# Patient Record
Sex: Female | Born: 1995 | Race: White | Hispanic: No | Marital: Single | Smoking: Never smoker
Health system: Southern US, Community
[De-identification: ages and names within clinical notes are randomized; demographics above are authoritative.]

---
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
## 2017-07-31 ENCOUNTER — Encounter (HOSPITAL_COMMUNITY): Payer: Self-pay | Admitting: Emergency Medicine

## 2017-07-31 DIAGNOSIS — K529 Noninfective gastroenteritis and colitis, unspecified: Secondary | ICD-10-CM | POA: Diagnosis not present

## 2017-07-31 DIAGNOSIS — N83201 Unspecified ovarian cyst, right side: Secondary | ICD-10-CM | POA: Insufficient documentation

## 2017-07-31 DIAGNOSIS — R1030 Lower abdominal pain, unspecified: Secondary | ICD-10-CM | POA: Diagnosis present

## 2017-07-31 LAB — URINALYSIS, ROUTINE W REFLEX MICROSCOPIC
BILIRUBIN URINE: NEGATIVE
Glucose, UA: NEGATIVE mg/dL
Ketones, ur: 5 mg/dL — AB
Nitrite: NEGATIVE
PH: 6 (ref 5.0–8.0)
PROTEIN: NEGATIVE mg/dL
Specific Gravity, Urine: 1.01 (ref 1.005–1.030)

## 2017-07-31 LAB — COMPREHENSIVE METABOLIC PANEL
ALT: 15 U/L (ref 14–54)
AST: 22 U/L (ref 15–41)
Albumin: 4.2 g/dL (ref 3.5–5.0)
Alkaline Phosphatase: 61 U/L (ref 38–126)
Anion gap: 13 (ref 5–15)
BUN: 5 mg/dL — ABNORMAL LOW (ref 6–20)
CHLORIDE: 100 mmol/L — AB (ref 101–111)
CO2: 21 mmol/L — AB (ref 22–32)
Calcium: 9 mg/dL (ref 8.9–10.3)
Creatinine, Ser: 0.65 mg/dL (ref 0.44–1.00)
GFR calc non Af Amer: >60 mL/min (ref >60)
Glucose, Bld: 90 mg/dL (ref 65–99)
POTASSIUM: 3.6 mmol/L (ref 3.5–5.1)
SODIUM: 134 mmol/L — AB (ref 135–145)
Total Bilirubin: 0.6 mg/dL (ref 0.3–1.2)
Total Protein: 7.5 g/dL (ref 6.5–8.1)

## 2017-07-31 LAB — CBC
HEMATOCRIT: 40.8 % (ref 36.0–46.0)
Hemoglobin: 13.4 g/dL (ref 12.0–15.0)
MCH: 27.7 pg (ref 26.0–34.0)
MCHC: 32.8 g/dL (ref 30.0–36.0)
MCV: 84.3 fL (ref 78.0–100.0)
Platelets: 278 10*3/uL (ref 150–400)
RBC: 4.84 MIL/uL (ref 3.87–5.11)
RDW: 13.3 % (ref 11.5–15.5)
WBC: 9.2 10*3/uL (ref 4.0–10.5)

## 2017-07-31 LAB — I-STAT BETA HCG BLOOD, ED (MC, WL, AP ONLY): HCG, QUANTITATIVE: 13.7 m[IU]/mL — AB (ref <5)

## 2017-07-31 LAB — LIPASE, BLOOD: LIPASE: 33 U/L (ref 11–51)

## 2017-07-31 NOTE — ED Triage Notes (Signed)
Per Pt: C/O of RLQ pain x 2 days. Pt denies N/V/D but does not have much of an appetite. Pt has some tenderness in her back and neck. Pt seen at Urgent Care today and was sent to ED to rule out Appendicitis. A&Ox4, ambulatory.

## 2017-08-01 ENCOUNTER — Emergency Department (HOSPITAL_COMMUNITY)
Admission: EM | Admit: 2017-08-01 | Discharge: 2017-08-01 | Disposition: A | Discharge status: Home / Self Care | Payer: Commercial Managed Care - PPO | Attending: Emergency Medicine | Admitting: Emergency Medicine

## 2017-08-01 ENCOUNTER — Emergency Department (HOSPITAL_COMMUNITY): Payer: Commercial Managed Care - PPO

## 2017-08-01 DIAGNOSIS — K529 Noninfective gastroenteritis and colitis, unspecified: Secondary | ICD-10-CM

## 2017-08-01 DIAGNOSIS — N83201 Unspecified ovarian cyst, right side: Secondary | ICD-10-CM

## 2017-08-01 LAB — WET PREP, GENITAL
SPERM: NONE SEEN
Trich, Wet Prep: NONE SEEN
YEAST WET PREP: NONE SEEN

## 2017-08-01 LAB — HCG, QUANTITATIVE, PREGNANCY: hCG, Beta Chain, Quant, S: 2 m[IU]/mL (ref <5)

## 2017-08-01 LAB — PREGNANCY, URINE: PREG TEST UR: NEGATIVE

## 2017-08-01 LAB — GC/CHLAMYDIA PROBE AMP (~~LOC~~) NOT AT ARMC
Chlamydia: NEGATIVE
NEISSERIA GONORRHEA: NEGATIVE

## 2017-08-01 MED ORDER — CIPROFLOXACIN HCL 500 MG PO TABS
500.0000 mg | ORAL_TABLET | Freq: Once | ORAL | Status: AC
  Administered 2017-08-01: 500 mg via ORAL
  Filled 2017-08-01: qty 1

## 2017-08-01 MED ORDER — CIPROFLOXACIN HCL 500 MG PO TABS: 500.0000 mg | ORAL_TABLET | Freq: Two times a day (BID) | ORAL | 0 refills | Status: AC

## 2017-08-01 MED ORDER — IOPAMIDOL (ISOVUE-300) INJECTION 61%
INTRAVENOUS | Status: AC
  Administered 2017-08-01: 100 mL
  Filled 2017-08-01: qty 100

## 2017-08-01 MED ORDER — PROBIOTIC PO CAPS: 1.0000 | ORAL_CAPSULE | Freq: Every day | ORAL | 1 refills | Status: AC

## 2017-08-01 MED ORDER — METRONIDAZOLE 500 MG PO TABS: 500.0000 mg | ORAL_TABLET | Freq: Three times a day (TID) | ORAL | 0 refills | Status: AC

## 2017-08-01 MED ORDER — METRONIDAZOLE 500 MG PO TABS
500.0000 mg | ORAL_TABLET | Freq: Once | ORAL | Status: AC
  Administered 2017-08-01: 500 mg via ORAL
  Filled 2017-08-01: qty 1

## 2017-08-01 MED ORDER — ONDANSETRON 4 MG PO TBDP
4.0000 mg | ORAL_TABLET | Freq: Once | ORAL | Status: AC
  Administered 2017-08-01: 4 mg via ORAL
  Filled 2017-08-01: qty 1

## 2017-08-01 MED ORDER — ONDANSETRON 4 MG PO TBDP: 4.0000 mg | ORAL_TABLET | Freq: Four times a day (QID) | ORAL | 0 refills | Status: AC | PRN

## 2017-08-01 MED ORDER — ACETAMINOPHEN 500 MG PO TABS
1000.0000 mg | ORAL_TABLET | Freq: Once | ORAL | Status: AC
  Administered 2017-08-01: 1000 mg via ORAL
  Filled 2017-08-01: qty 2

## 2017-08-01 MED ORDER — DICYCLOMINE HCL 20 MG PO TABS: 20.0000 mg | ORAL_TABLET | Freq: Three times a day (TID) | ORAL | 0 refills | Status: AC

## 2017-08-01 NOTE — Discharge Instructions (Signed)
You may alternate Tylenol 1000 mg every 6 hours as needed for pain, fever and Ibuprofen 800 mg every 8 hours as needed for pain, fever.  Please take Ibuprofen with food. ° ° °To find a primary care or specialty doctor please call [phone] or [phone] to access "[facility] Find a Doctor Service." ° °You may also go on the [facility] website at [email or web address] ° °There are also multiple [facility], [facility] and [facility] practices throughout the [place] that are frequently accepting new patients. You may find a clinic that is close to your home and contact them. ° °[facility] -  °[address] °[place] [address] °[phone] ° ° °[facility] -  °[address] °[place] [address] [phone] ° ° °[facility] - °[address]  °[place] [address] °[phone] ° ° ° °

## 2017-08-01 NOTE — ED Provider Notes (Signed)
Patient placed in [facility] pathway, seen and evaluated   Chief Complaint: Abdominal pain.   HPI:   22 y.o. F who presents for 2 days of right lower quadrant abdominal pain.  She reports that she has had some nausea yesterday.  She reports some decreased p.o. yesterday.  She denies any vomiting, fever, diarrhea.  Patient was seen in urgent care today for evaluation of symptoms.  At that time, she was sent to the ED for further evaluation and patient denies any chest pain, difficulty breathing, vaginal discharge, vaginal bleeding.  ROS: Abdominal Pain (one)  Physical Exam:   Gen: No distress  Neuro: Awake and Alert  Skin: Warm    Focused Exam: Tenderness palpation to the right mid and lower quadrant.  No rigidity, guarding.  No peritoneal signs.    Will plan to check basic labs, including i-STAT beta determine ectopic pregnancy versus appendicitis.  I-STAT beta is slightly positive at 13.7.  Urine pregnancy is negative.  We will plan for hCG quant.  CT abdomen pelvis pending results of hCG quant.  If negative, plan for CT evaluation.  If positive, will plan for ultrasound evaluation for rule out ectopic pregnancy.  Initiation of care has begun. The patient has been counseled on the process, plan, and necessity for staying for the completion/evaluation, and the remainder of the medical screening examination    Rosana HoesLayden, Chanz Cahall A, PA-C 08/01/17 1604    Terrilee FilesButler, Michael C, MD 08/03/17 [phone]

## 2017-08-01 NOTE — ED Provider Notes (Signed)
TIME SEEN: 4:21 AM  CHIEF COMPLAINT: Fever, abdominal pain  HPI: Patient is a 22 year old female with no significant past medical history no previous abdominal surgery who presents to the emergency department with lower abdominal pain, fevers, chills, decreased appetite for the past 2 days.  Was seen at urgent care and sent here to rule out appendicitis.  No nausea, vomiting or diarrhea.  No dysuria, hematuria, vaginal bleeding or discharge.  Last menstrual period was January 17.  Not currently sexually active but has been sexually active before.  No history of pregnancies or STDs.  ROS: See HPI Constitutional:  fever  Eyes: no drainage  ENT: no runny nose   Cardiovascular:  no chest pain  Resp: no SOB  GI: no vomiting GU: no dysuria Integumentary: no rash  Allergy: no hives  Musculoskeletal: no leg swelling  Neurological: no slurred speech ROS otherwise negative  PAST MEDICAL HISTORY/PAST SURGICAL HISTORY:  History reviewed. No pertinent past medical history.  MEDICATIONS:  Prior to Admission medications   Not on File    ALLERGIES:  Not on File  SOCIAL HISTORY:  Social History   Tobacco Use  . Smoking status: Never Smoker  . Smokeless tobacco: Never Used  Substance Use Topics  . Alcohol use: No    Frequency: Never    FAMILY HISTORY: No family history on file.  EXAM: BP 118/79   Pulse (!) 113   Temp 99.1 F (37.3 C) (Oral)   Resp 16   Ht 5\' 10"  (1.778 m)   Wt 71.7 kg (158 lb)   LMP 07/04/2017 (Exact Date)   SpO2 100%   BMI 22.67 kg/m  CONSTITUTIONAL: Alert and oriented and responds appropriately to questions. Well-appearing; well-nourished, nontoxic-appearing HEAD: Normocephalic EYES: Conjunctivae clear, pupils appear equal, EOMI ENT: normal nose; moist mucous membranes NECK: Supple, no meningismus, no nuchal rigidity, no LAD  CARD: Regular and tachycardic; S1 and S2 appreciated; no murmurs, no clicks, no rubs, no gallops RESP: Normal chest excursion  without splinting or tachypnea; breath sounds clear and equal bilaterally; no wheezes, no rhonchi, no rales, no hypoxia or respiratory distress, speaking full sentences ABD/GI: Normal bowel sounds; non-distended; soft, diffusely tender throughout the lower abdomen, no rebound, no guarding, no peritoneal signs, no hepatosplenomegaly GU:  Normal external genitalia. No lesions, rashes noted. Patient has no vaginal bleeding on exam. No significant vaginal discharge.  No adnexal tenderness, mass or fullness, no cervical motion tenderness. Cervix is not appear friable.  Cervix is closed.  Chaperone present for exam. BACK:  The back appears normal and is non-tender to palpation, there is no CVA tenderness EXT: Normal ROM in all joints; non-tender to palpation; no edema; normal capillary refill; no cyanosis, no calf tenderness or swelling    SKIN: Normal color for age and race; warm; no rash NEURO: Moves all extremities equally PSYCH: The patient's mood and manner are appropriate. Grooming and personal hygiene are appropriate.  MEDICAL DECISION MAKING: Patient here with lower abdominal pain.  Labs and CT scan ordered in triage.  Blood work shows no acute abnormality.  Normal LFTs, lipase and no leukocytosis.  Pregnancy test is negative.  Urine shows no sign of infection.  Patient still tachycardic but does have an oral temperature of 99.8.  Will give Tylenol.  She states she feels this will be enough for her pain as well.  CT scan shows ascending colitis which I feel is likely infectious especially given her fever.  She denies any recent travel.  No recent antibiotic  use.  Her appendix appears normal.  No history of Crohn's or ulcerative colitis or other inflammatory bowel disease but have recommended follow-up with gastroenterology when symptoms have improved and antibiotics are complete.  She also has free fluid in the right adnexa with corpus luteal cyst but her pelvic exam is completely unremarkable.  Doubt  ovarian torsion.  No sign of TOA or any pelvic infection at this time.  Will treat with Cipro, Flagyl.  Will encourage oral fluids and reassess to ensure heart rate is improving.  Anticipate discharge home.  ED PROGRESS: Patient reports feeling much better.  Tylenol controlled her pain.  Her heart rate has improved.  Drinking without difficulty.  Wet prep positive for clue cells.  This will be covered with Flagyl for her colitis.  Will give outpatient PCP and GI follow-up.  Recommend alternating Tylenol and Motrin for fever and pain.  Will discharge with Zofran and Bentyl as well.  Discussed return precautions.  Patient denies bloody stools or melena.   At this time, I do not feel there is any life-threatening condition present. I have reviewed and discussed all results (EKG, imaging, lab, urine as appropriate) and exam findings with patient/family. I have reviewed nursing notes and appropriate previous records.  I feel the patient is safe to be discharged home without further emergent workup and can continue workup as an outpatient as needed. Discussed usual and customary return precautions. Patient/family verbalize understanding and are comfortable with this plan.  Outpatient follow-up has been provided if needed. All questions have been answered.      Ward, Layla MawKristen N, DO 08/01/17 [phone]

## 2018-12-08 IMAGING — CT CT ABD-PELV W/ CM
2 of 4 series · 16 of 46 positions shown, 18 images · IV contrast (APPLIED)
Comparison: None.

CLINICAL DATA: Acute abdominal pain. Right lower quadrant pain for
2 days.

EXAM:
CT ABDOMEN AND PELVIS WITH CONTRAST
TECHNIQUE: Multidetector CT imaging of the abdomen and pelvis was performed
using the standard protocol following bolus administration of
intravenous contrast.
CONTRAST:  100mL 5XGKYF-GYY IOPAMIDOL (5XGKYF-GYY) INJECTION 61%

[Series 3: abdomen 5.0 · axial · 0.74mm/px · z∈[+682,+1072]mm · 13 of 88 slices shown, 15 images]
[im 5/88  soft-tissue]
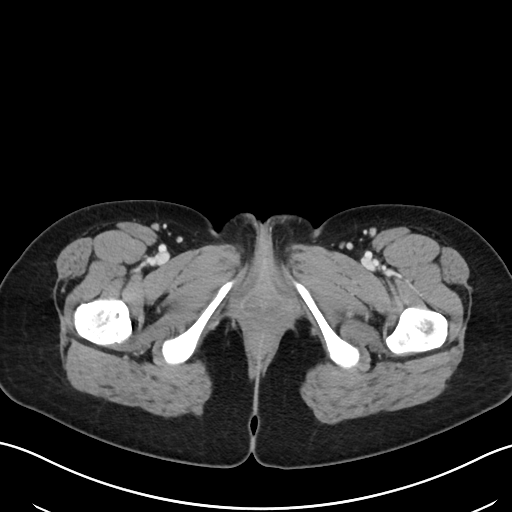
[im 5/88  bone]
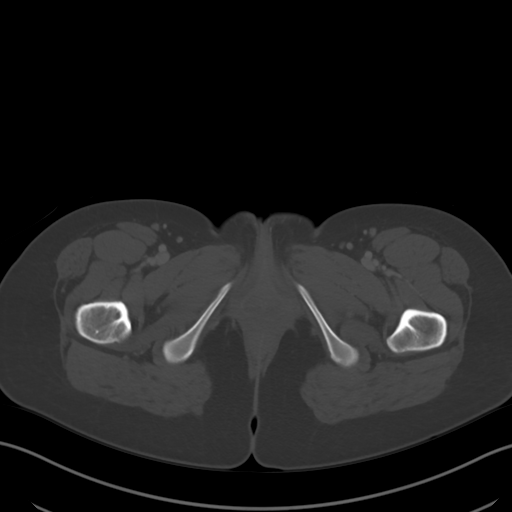
[im 14/88  soft-tissue]
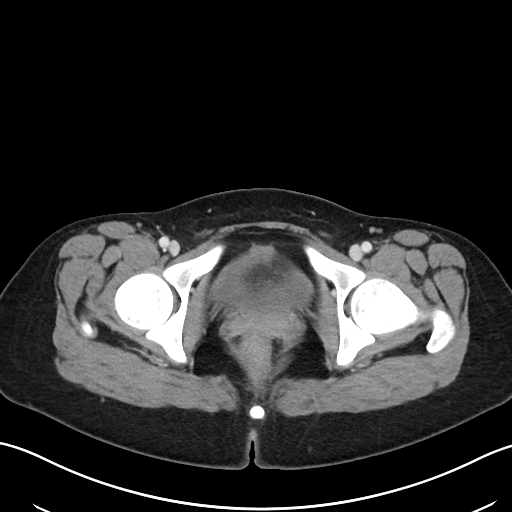
[im 19/88  soft-tissue]
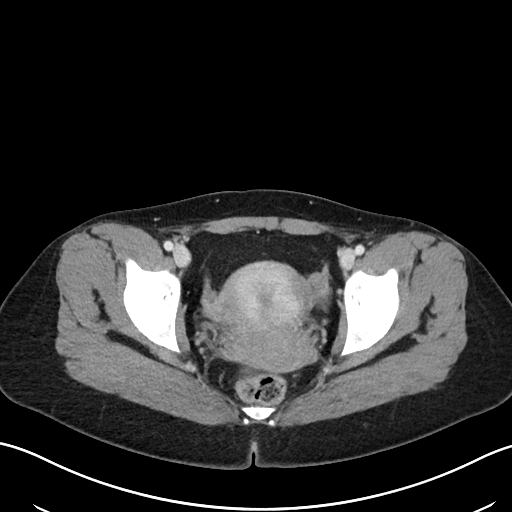
[im 23/88  soft-tissue]
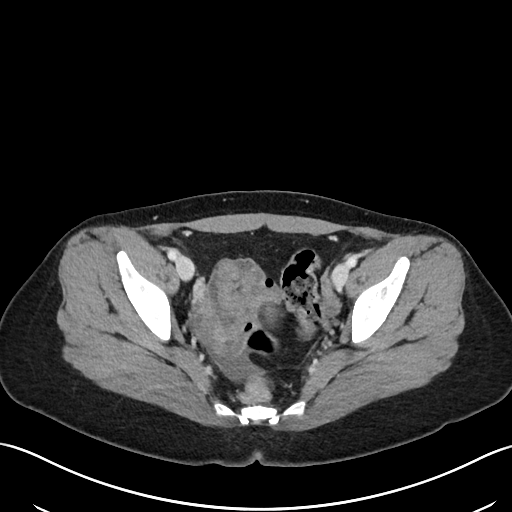
[im 33/88  soft-tissue]
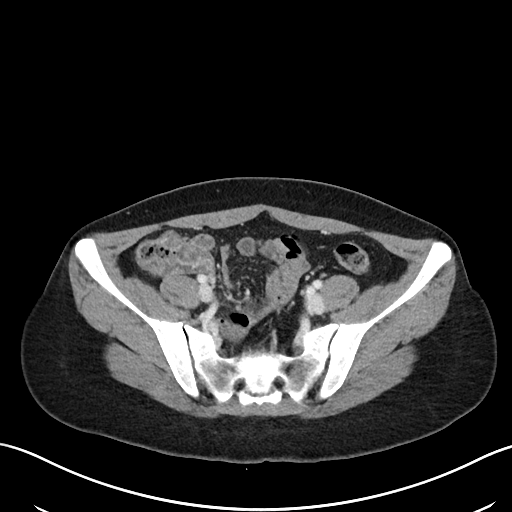
[im 37/88  soft-tissue]
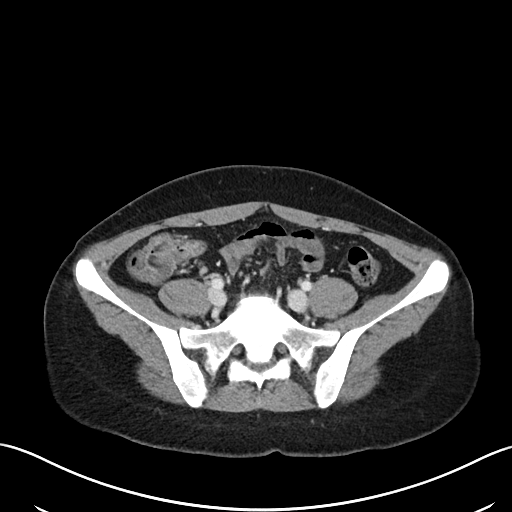
[im 46/88  soft-tissue]
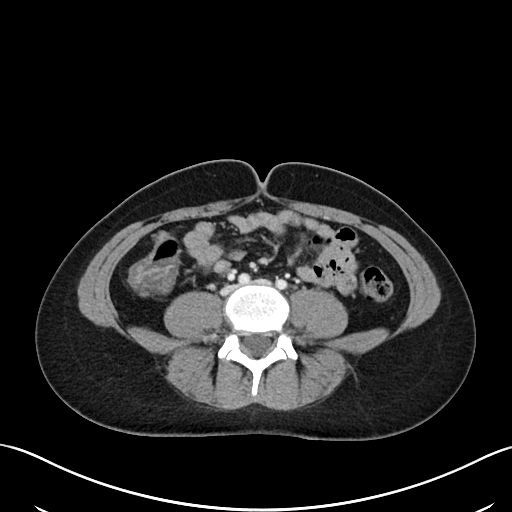
[im 51/88  soft-tissue]
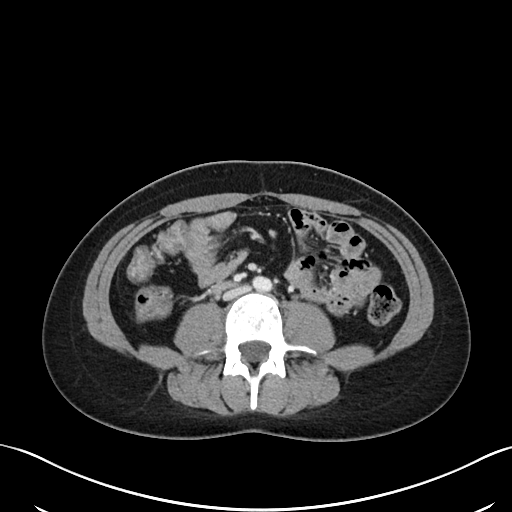
[im 55/88  soft-tissue]
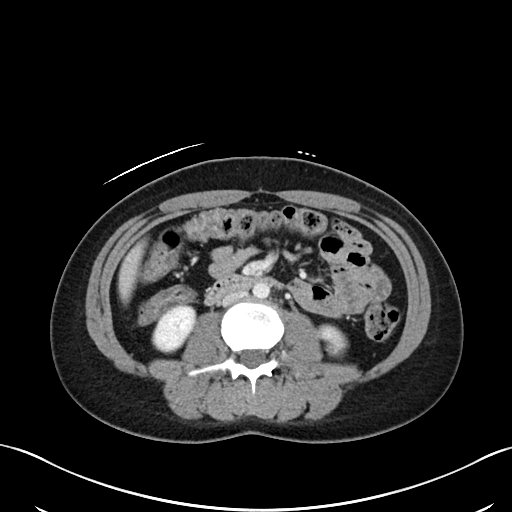
[im 55/88  bone]
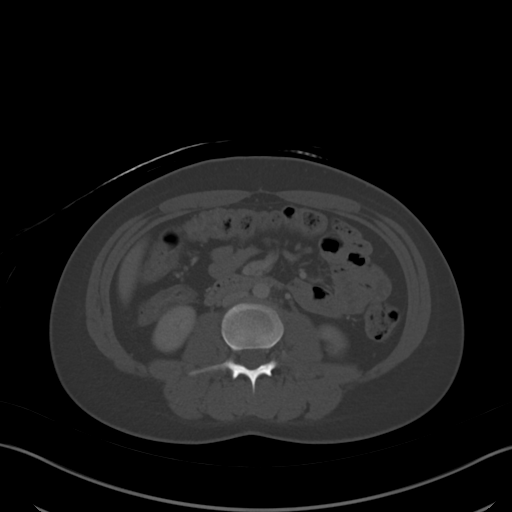
[im 65/88  soft-tissue]
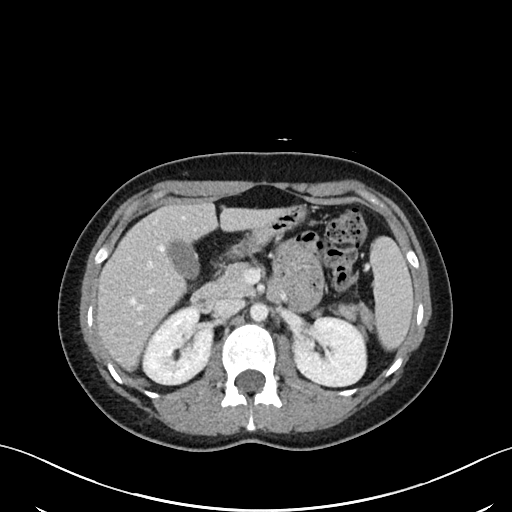
[im 69/88  soft-tissue]
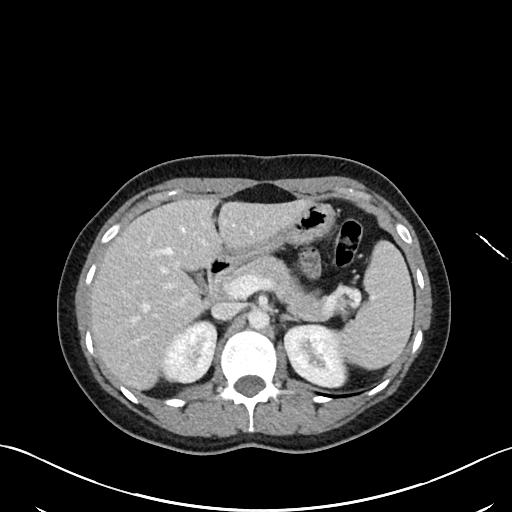
[im 74/88  soft-tissue]
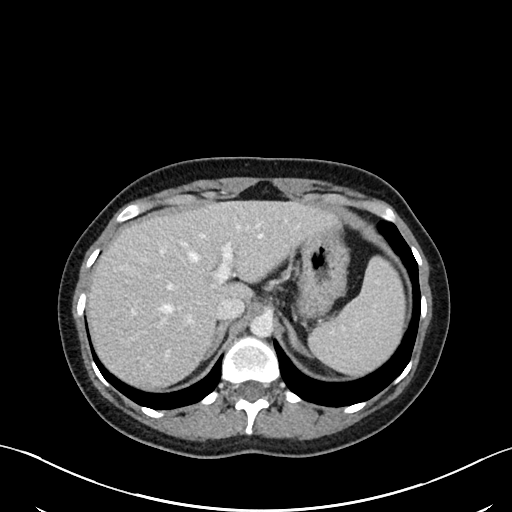
[im 83/88  soft-tissue]
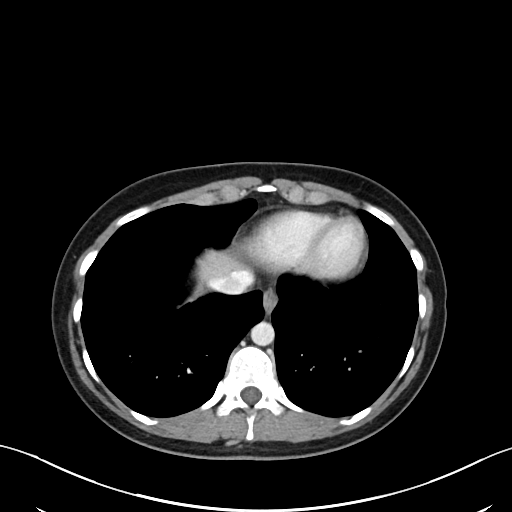

[Series 6: abdomen 3.0 mpr cor · coronal · 0.66mm/px · 3 of 70 slices shown]
[im 24/70  soft-tissue]
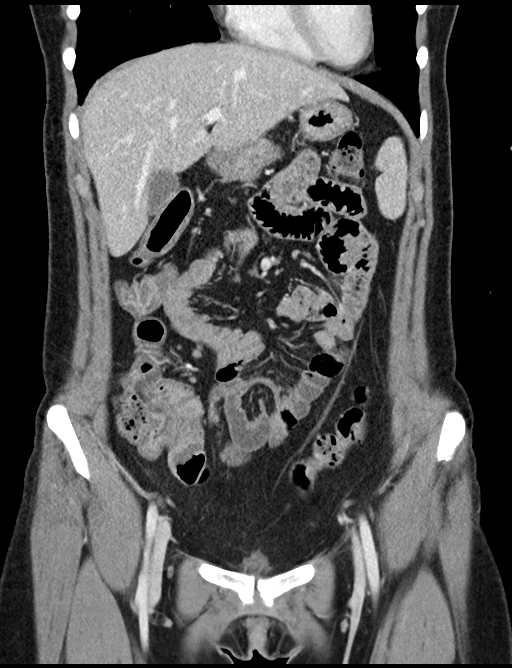
[im 31/70  soft-tissue]
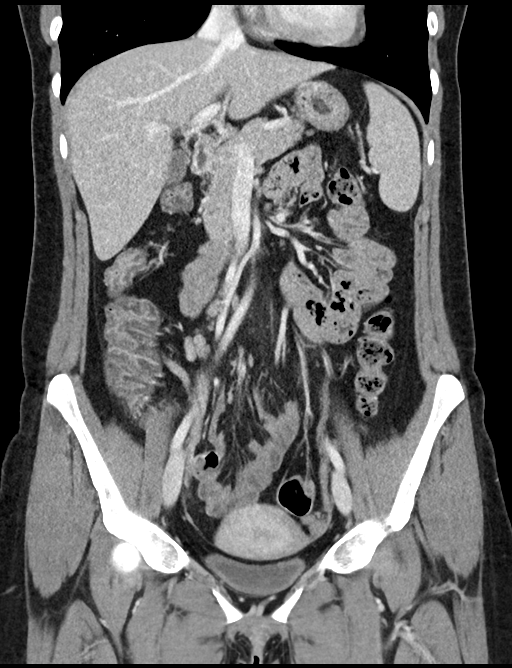
[im 39/70  soft-tissue]
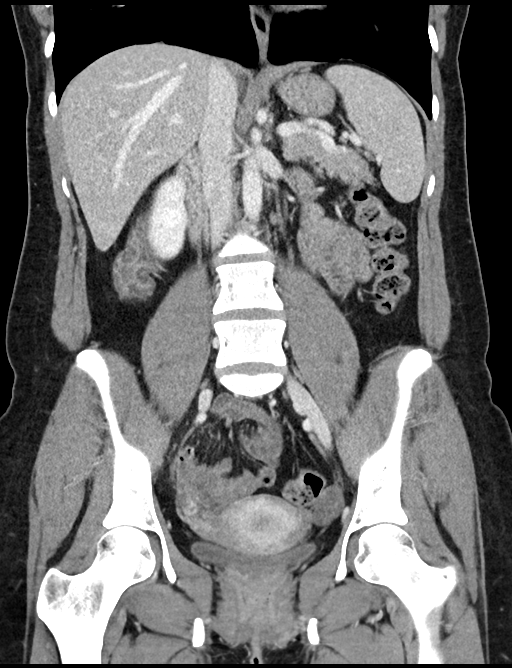

[16 of 46 positions shown; findings below may reference images not displayed]

FINDINGS: Lower chest: The lung bases are clear.

Hepatobiliary: No focal liver abnormality is seen. No gallstones,
gallbladder wall thickening, or biliary dilatation.

Pancreas: No ductal dilatation or inflammation.

Spleen: Normal in size without focal abnormality.

Adrenals/Urinary Tract: Normal adrenal glands. No hydronephrosis or
perinephric edema. Homogeneous renal enhancement. Urinary bladder is
only minimally distended without wall thickening.

Stomach/Bowel: Colonic wall thickening from the cecum through the
proximal transverse colon. Mild pericolonic edema. No definite
terminal ileal inflammation. Remaining small bowel is unremarkable.
Normal appendix. Stomach is nondistended.

Vascular/Lymphatic: Prominent ileocolic nodes, likely reactive. No
acute vascular abnormality.

Reproductive: Probable corpus luteal cyst in the right ovary. Small
volume complex free fluid in the right adnexa. Uterus and left
adnexa are unremarkable.

Other: No free air or intra-abdominal abscess.

Musculoskeletal: Bilateral L5 pars interarticularis defects without
listhesis. There are no acute or suspicious osseous abnormalities.
IMPRESSION: 1. Ascending colonic wall thickening with mild pericolonic edema
consistent with colitis, may be infectious or inflammatory. No
definite terminal ileal involvement. The appendix is normal.
2. Complex free fluid in the right adnexa with a possible corpus
luteal cyst. Complex fluid may be reactive or secondary to cyst
rupture.
3. Incidental note of bilateral L5 pars interarticularis defects
without listhesis.
